# Patient Record
Sex: Male | Born: 1960 | Race: White | Hispanic: No | Marital: Single | Smoking: Current every day smoker
Health system: Southern US, Community
[De-identification: ages and names within clinical notes are randomized; demographics above are authoritative.]

## PROBLEM LIST (undated history)

## (undated) DIAGNOSIS — F17200 Nicotine dependence, unspecified, uncomplicated: Secondary | ICD-10-CM

## (undated) DIAGNOSIS — E785 Hyperlipidemia, unspecified: Secondary | ICD-10-CM

## (undated) DIAGNOSIS — R002 Palpitations: Secondary | ICD-10-CM

## (undated) DIAGNOSIS — I10 Essential (primary) hypertension: Secondary | ICD-10-CM

## (undated) HISTORY — PX: HERNIA REPAIR: SHX51

## (undated) HISTORY — PX: BACK SURGERY: SHX140

---

## 2017-11-24 ENCOUNTER — Encounter (HOSPITAL_COMMUNITY): Payer: Self-pay | Admitting: Emergency Medicine

## 2017-11-24 ENCOUNTER — Observation Stay (HOSPITAL_COMMUNITY): Payer: 59

## 2017-11-24 ENCOUNTER — Other Ambulatory Visit: Payer: Self-pay

## 2017-11-24 ENCOUNTER — Emergency Department (HOSPITAL_COMMUNITY): Payer: 59

## 2017-11-24 ENCOUNTER — Observation Stay (HOSPITAL_COMMUNITY)
Admission: EM | Admit: 2017-11-24 | Discharge: 2017-11-25 | Disposition: A | Discharge status: Home / Self Care | Payer: 59 | Attending: Cardiovascular Disease | Admitting: Cardiovascular Disease

## 2017-11-24 DIAGNOSIS — R0789 Other chest pain: Principal | ICD-10-CM | POA: Insufficient documentation

## 2017-11-24 DIAGNOSIS — Z720 Tobacco use: Secondary | ICD-10-CM

## 2017-11-24 DIAGNOSIS — N189 Chronic kidney disease, unspecified: Secondary | ICD-10-CM | POA: Diagnosis not present

## 2017-11-24 DIAGNOSIS — I2510 Atherosclerotic heart disease of native coronary artery without angina pectoris: Secondary | ICD-10-CM | POA: Insufficient documentation

## 2017-11-24 DIAGNOSIS — E782 Mixed hyperlipidemia: Secondary | ICD-10-CM | POA: Diagnosis not present

## 2017-11-24 DIAGNOSIS — R002 Palpitations: Secondary | ICD-10-CM

## 2017-11-24 DIAGNOSIS — I10 Essential (primary) hypertension: Secondary | ICD-10-CM

## 2017-11-24 DIAGNOSIS — I700 Atherosclerosis of aorta: Secondary | ICD-10-CM | POA: Diagnosis not present

## 2017-11-24 DIAGNOSIS — E785 Hyperlipidemia, unspecified: Secondary | ICD-10-CM | POA: Diagnosis present

## 2017-11-24 DIAGNOSIS — Z7982 Long term (current) use of aspirin: Secondary | ICD-10-CM | POA: Diagnosis not present

## 2017-11-24 DIAGNOSIS — R079 Chest pain, unspecified: Secondary | ICD-10-CM | POA: Diagnosis present

## 2017-11-24 DIAGNOSIS — F17210 Nicotine dependence, cigarettes, uncomplicated: Secondary | ICD-10-CM | POA: Diagnosis not present

## 2017-11-24 DIAGNOSIS — F17200 Nicotine dependence, unspecified, uncomplicated: Secondary | ICD-10-CM | POA: Diagnosis not present

## 2017-11-24 DIAGNOSIS — Z79899 Other long term (current) drug therapy: Secondary | ICD-10-CM | POA: Insufficient documentation

## 2017-11-24 DIAGNOSIS — I129 Hypertensive chronic kidney disease with stage 1 through stage 4 chronic kidney disease, or unspecified chronic kidney disease: Secondary | ICD-10-CM | POA: Insufficient documentation

## 2017-11-24 HISTORY — DX: Essential (primary) hypertension: I10

## 2017-11-24 HISTORY — DX: Hyperlipidemia, unspecified: E78.5

## 2017-11-24 HISTORY — DX: Nicotine dependence, unspecified, uncomplicated: F17.200

## 2017-11-24 HISTORY — DX: Palpitations: R00.2

## 2017-11-24 LAB — BASIC METABOLIC PANEL
ANION GAP: 8 (ref 5–15)
BUN: 14 mg/dL (ref 6–20)
CHLORIDE: 107 mmol/L (ref 101–111)
CO2: 23 mmol/L (ref 22–32)
CREATININE: 0.84 mg/dL (ref 0.61–1.24)
Calcium: 9.5 mg/dL (ref 8.9–10.3)
GFR calc non Af Amer: >60 mL/min (ref >60)
GLUCOSE: 101 mg/dL — AB (ref 65–99)
Potassium: 4.2 mmol/L (ref 3.5–5.1)
Sodium: 138 mmol/L (ref 135–145)

## 2017-11-24 LAB — CBC
HCT: 43.2 % (ref 39.0–52.0)
HEMATOCRIT: 43.3 % (ref 39.0–52.0)
HEMOGLOBIN: 14.5 g/dL (ref 13.0–17.0)
HEMOGLOBIN: 14.3 g/dL (ref 13.0–17.0)
MCH: 30.8 pg (ref 26.0–34.0)
MCH: 30.4 pg (ref 26.0–34.0)
MCHC: 33.6 g/dL (ref 30.0–36.0)
MCHC: 33 g/dL (ref 30.0–36.0)
MCV: 91.7 fL (ref 78.0–100.0)
MCV: 92.1 fL (ref 78.0–100.0)
Platelets: 246 10*3/uL (ref 150–400)
Platelets: 250 10*3/uL (ref 150–400)
RBC: 4.71 MIL/uL (ref 4.22–5.81)
RBC: 4.7 MIL/uL (ref 4.22–5.81)
RDW: 12.7 % (ref 11.5–15.5)
RDW: 12.7 % (ref 11.5–15.5)
WBC: 6.9 10*3/uL (ref 4.0–10.5)
WBC: 7.5 10*3/uL (ref 4.0–10.5)

## 2017-11-24 LAB — TROPONIN I

## 2017-11-24 LAB — I-STAT TROPONIN, ED: Troponin i, poc: 0 ng/mL (ref 0.00–0.08)

## 2017-11-24 LAB — CREATININE, SERUM
Creatinine, Ser: 0.89 mg/dL (ref 0.61–1.24)
GFR calc Af Amer: >60 mL/min (ref >60)
GFR calc non Af Amer: >60 mL/min (ref >60)

## 2017-11-24 LAB — MAGNESIUM: MAGNESIUM: 2.3 mg/dL (ref 1.7–2.4)

## 2017-11-24 LAB — MRSA PCR SCREENING: MRSA by PCR: NEGATIVE

## 2017-11-24 MED ORDER — IOPAMIDOL (ISOVUE-370) INJECTION 76%
INTRAVENOUS | Status: AC
  Administered 2017-11-24: 80 mL
  Filled 2017-11-24: qty 100

## 2017-11-24 MED ORDER — ONDANSETRON HCL 4 MG/2ML IJ SOLN: 4.0000 mg | Freq: Four times a day (QID) | INTRAMUSCULAR | Status: DC | PRN

## 2017-11-24 MED ORDER — NITROGLYCERIN 0.4 MG SL SUBL: 0.4000 mg | SUBLINGUAL_TABLET | SUBLINGUAL | Status: DC | PRN

## 2017-11-24 MED ORDER — NITROGLYCERIN 0.4 MG SL SUBL
SUBLINGUAL_TABLET | SUBLINGUAL | Status: AC
  Filled 2017-11-24: qty 2

## 2017-11-24 MED ORDER — ASPIRIN 300 MG RE SUPP: 300.0000 mg | RECTAL | Status: DC

## 2017-11-24 MED ORDER — ASPIRIN EC 81 MG PO TBEC
81.0000 mg | DELAYED_RELEASE_TABLET | Freq: Every day | ORAL | Status: DC
  Administered 2017-11-25: 81 mg via ORAL
  Filled 2017-11-24: qty 1

## 2017-11-24 MED ORDER — METOPROLOL TARTRATE 5 MG/5ML IV SOLN
5.0000 mg | Freq: Once | INTRAVENOUS | Status: AC
  Administered 2017-11-24: 5 mg via INTRAVENOUS
  Filled 2017-11-24: qty 5

## 2017-11-24 MED ORDER — LISINOPRIL 20 MG PO TABS
20.0000 mg | ORAL_TABLET | Freq: Every day | ORAL | Status: DC
  Administered 2017-11-25: 20 mg via ORAL
  Filled 2017-11-24: qty 1

## 2017-11-24 MED ORDER — ACETAMINOPHEN 325 MG PO TABS: 650.0000 mg | ORAL_TABLET | ORAL | Status: DC | PRN

## 2017-11-24 MED ORDER — ATORVASTATIN CALCIUM 20 MG PO TABS
20.0000 mg | ORAL_TABLET | Freq: Every day | ORAL | Status: DC
  Administered 2017-11-25: 20 mg via ORAL
  Filled 2017-11-24: qty 1

## 2017-11-24 MED ORDER — ASPIRIN 81 MG PO CHEW: 324.0000 mg | CHEWABLE_TABLET | ORAL | Status: DC

## 2017-11-24 MED ORDER — NITROGLYCERIN 0.4 MG SL SUBL: 0.8000 mg | SUBLINGUAL_TABLET | Freq: Once | SUBLINGUAL | Status: DC

## 2017-11-24 MED ORDER — HEPARIN SODIUM (PORCINE) 5000 UNIT/ML IJ SOLN
5000.0000 [IU] | Freq: Three times a day (TID) | INTRAMUSCULAR | Status: DC
  Administered 2017-11-24 – 2017-11-25 (×3): 5000 [IU] via SUBCUTANEOUS
  Filled 2017-11-24 (×3): qty 1

## 2017-11-24 NOTE — ED Triage Notes (Signed)
PT reports he developed intermittent chest pain yesterday around noon. PT was seen at [facility]. PT reports intermittent chest pain with soreness to left chest and in left arm. Not exacerbated by movement. Duration varies. PT had diaphoresis with an episode earlier today. PT reports increased fatigue after episodes. PT had 2 SL nitro in route that decreased pain from 6/10 to 2/10.   PT had 324 ASA in route as well.

## 2017-11-24 NOTE — H&P (Signed)
Physician History and Physical     Patient ID: Frederick Aguilar MRN: [id number] DOB/AGE: 1961-06-15 57 y.o. Admit date: 11/24/2017  Primary Care Physician: Eartha Inch, MD Primary Cardiologist: [facility]  Active Problems:   * No active hospital problems. *   HPI:  57 y.o. no known CAD. History of palpitations seen by cardiologist over 5 years ago but never could get use to wearing monitor. CRF;s smoking, HLD and HTN. Had long episode of tachy palpitations this am. Lasted over an hour. Associated with chest tightness but no dyspnea. Some diaphoresis. EMS with no arrhythmia SL nitro some benefit. Asymptomatic now. Initial ECG normal with no pre excitation Telemetry NSR. Initial enzymes negative He is from [place] and has 4 boys Works for [employer]. Palpitations are usually sudden onset Gets about every 3 months Feels exhausted afterwards and usually needs to sleep. No known thyroid disease. No stimulants or drugs.   Review of systems complete and found to be negative unless listed above   Past Medical History:  Diagnosis Date  . Hyperlipemia   . Hypertension     No family history on file.  Social History   Socioeconomic History  . Marital status: Single    Spouse name: Not on file  . Number of children: Not on file  . Years of education: Not on file  . Highest education level: Not on file  Occupational History  . Not on file  Social Needs  . Financial resource strain: Not on file  . Food insecurity:    Worry: Not on file    Inability: Not on file  . Transportation needs:    Medical: Not on file    Non-medical: Not on file  Tobacco Use  . Smoking status: Current Every Day Smoker    Packs/day: 0.50    Types: Cigarettes  . Smokeless tobacco: Never Used  Substance and Sexual Activity  . Alcohol use: Yes  . Drug use: Never  . Sexual activity: Not on file  Lifestyle  . Physical activity:    Days per week: Not on file    Minutes per session: Not on file  . Stress: Not on  file  Relationships  . Social connections:    Talks on phone: Not on file    Gets together: Not on file    Attends religious service: Not on file    Active member of club or organization: Not on file    Attends meetings of clubs or organizations: Not on file    Relationship status: Not on file  . Intimate partner violence:    Fear of current or ex partner: Not on file    Emotionally abused: Not on file    Physically abused: Not on file    Forced sexual activity: Not on file  Other Topics Concern  . Not on file  Social History Narrative  . Not on file    Past Surgical History:  Procedure Laterality Date  . BACK SURGERY    . HERNIA REPAIR        (Not in a hospital admission)  Physical Exam: Blood pressure 132/83, pulse (!) 56, temperature 98.2 F (36.8 C), temperature source Oral, resp. rate 11, weight 180 lb (81.6 kg), SpO2 98 %.    Affect appropriate Healthy:  appears stated age HEENT: normal Neck supple with no adenopathy JVP normal no bruits no thyromegaly Lungs clear with no wheezing and good diaphragmatic motion Heart:  S1/S2 no murmur, no rub, gallop or click PMI normal  Abdomen: benighn, BS positve, no tenderness, no AAA no bruit.  No HSM or HJR Distal pulses intact with no bruits No edema Neuro non-focal Skin warm and dry No muscular weakness Missing tips 3rd and 4th finger on right hand old wood chip accident  No current facility-administered medications on file prior to encounter.    Current Outpatient Medications on File Prior to Encounter  Medication Sig Dispense Refill  . aspirin EC 81 MG tablet Take 81 mg by mouth daily.    Marland Kitchen. atorvastatin (LIPITOR) 40 MG tablet Take 20 mg by mouth daily.  1  . lisinopril (PRINIVIL,ZESTRIL) 20 MG tablet Take 20 mg by mouth daily.  1  . Multiple Vitamin (MULTIVITAMIN WITH MINERALS) TABS tablet Take 1 tablet by mouth daily.      Labs:   Lab Results  Component Value Date   WBC 6.9 11/24/2017   HGB 14.5  11/24/2017   HCT 43.2 11/24/2017   MCV 91.7 11/24/2017   PLT 246 11/24/2017   Recent Labs  Lab 11/24/17 0952  NA 138  K 4.2  CL 107  CO2 23  BUN 14  CREATININE 0.84  CALCIUM 9.5  GLUCOSE 101*   No results found for: CKTOTAL, CKMB, CKMBINDEX, TROPONINI  No results found for: CHOL No results found for: HDL No results found for: LDLCALC No results found for: TRIG No results found for: CHOLHDL No results found for: LDLDIRECT     Radiology: Dg Chest 2 View  Result Date: 11/24/2017 CLINICAL DATA:  Upper left chest pain today, smoking history EXAM: CHEST - 2 VIEW COMPARISON:  None. FINDINGS: No active infiltrate or effusion is seen. Mediastinal and hilar contours are unremarkable. The heart is within normal limits in size. No bony abnormality is seen. IMPRESSION: No active cardiopulmonary disease. Electronically Signed   By: Dwyane DeePaul  Barry M.D.   On: 11/24/2017 10:42    EKG: NSR normal ECG   ASSESSMENT AND PLAN:   Chest pain:  CRFls HTN, HLD smoking recurrent some help nitro EMS discussed options will order cardiac CTA has 18 g in left antecubital already  Palpitations : No arrhythmia noted ECG normal will likely need outpatient event monitor and PRN inderal  Smoking:  Discussed cessation wife is very worried about it CXR NAD  HTN:  Well controlled.  Continue current medications and low sodium Dash type diet.    HLD:  Continue statin    Signed: Theron Aristaeter Nishan4/04/2018, 12:07 PM

## 2017-11-24 NOTE — ED Notes (Signed)
Transport arrived for CT.

## 2017-11-24 NOTE — ED Provider Notes (Signed)
[facility] EMERGENCY DEPARTMENT Provider Note   CSN: [id number] Arrival date & time: 11/24/17  0931     History   Chief Complaint Chief Complaint  Patient presents with  . Chest Pain    HPI Frederick Aguilar is a 57 y.o. male with history of hypertension, hyperlipidemia who presents following an episode of tachycardia, chest pain, diaphoresis.  Patient reports he has had episodes like this in the past and gets them several times per year. They usually resolve in 1-2 hours if he lies down and takes a nap.  This time, the episode lasted longer.  He reports it started yesterday and improved a little before bed, however then began early this morning and lasted about 3 hours.  He went to his doctor.  He was sent here for further evaluation.  His chest pain is improving, however patient did get nitroglycerin in route.  He also was given aspirin 324 mg.  Patient reports he had left arm pain while he had chest pain.  He denies shortness of breath, nausea, vomiting, or abdominal pain.  He describes pain as a soreness.  It is not pleuritic.  Patient reports he is mostly back to baseline now.  Patient denies any recent long trips, surgeries, known cancer, new leg pain or swelling, history of blood clots.  Patient smokes 7-8 cigarettes daily.  HPI  Past Medical History:  Diagnosis Date  . Hyperlipemia   . Hypertension     Patient Active Problem List   Diagnosis Date Noted  . Chest pain 11/24/2017    Past Surgical History:  Procedure Laterality Date  . BACK SURGERY    . HERNIA REPAIR          Home Medications    Prior to Admission medications   Medication Sig Start Date End Date Taking? Authorizing Provider  aspirin EC 81 MG tablet Take 81 mg by mouth daily.   Yes [provider]  atorvastatin (LIPITOR) 40 MG tablet Take 20 mg by mouth daily. 11/15/17  Yes [provider]  lisinopril (PRINIVIL,ZESTRIL) 20 MG tablet Take 20 mg by mouth daily. 09/25/17   Yes [provider]  Multiple Vitamin (MULTIVITAMIN WITH MINERALS) TABS tablet Take 1 tablet by mouth daily.   Yes [provider]    Family History No family history on file.  Social History Social History   Tobacco Use  . Smoking status: Current Every Day Smoker    Packs/day: 0.50    Types: Cigarettes  . Smokeless tobacco: Never Used  Substance Use Topics  . Alcohol use: Yes  . Drug use: Never     Allergies   Patient has no known allergies.   Review of Systems Review of Systems  Constitutional: Positive for diaphoresis. Negative for chills and fever.  HENT: Negative for facial swelling and sore throat.   Respiratory: Negative for shortness of breath.   Cardiovascular: Positive for chest pain and palpitations.  Gastrointestinal: Negative for abdominal pain, nausea and vomiting.  Genitourinary: Negative for dysuria.  Musculoskeletal: Negative for back pain.  Skin: Negative for rash and wound.  Neurological: Negative for headaches.  Psychiatric/Behavioral: The patient is not nervous/anxious.      Physical Exam Updated Vital Signs BP 120/75   Pulse (!) 58   Temp 98.2 F (36.8 C) (Oral)   Resp 16   Wt 81.6 kg (180 lb)   SpO2 98%   Physical Exam  Constitutional: He appears well-developed and well-nourished. No distress.  HENT:  Head:  Normocephalic and atraumatic.  Mouth/Throat: Oropharynx is clear and moist. No oropharyngeal exudate.  Eyes: Pupils are equal, round, and reactive to light. Conjunctivae are normal. Right eye exhibits no discharge. Left eye exhibits no discharge. No scleral icterus.  Neck: Normal range of motion. Neck supple. No thyromegaly present.  Cardiovascular: Normal rate, regular rhythm, normal heart sounds and intact distal pulses. Exam reveals no gallop and no friction rub.  No murmur heard. Pulmonary/Chest: Effort normal and breath sounds normal. No stridor. No respiratory distress. He has no wheezes. He has no rales. He  exhibits no tenderness.  Abdominal: Soft. Bowel sounds are normal. He exhibits no distension. There is no tenderness. There is no rebound and no guarding.  Musculoskeletal: He exhibits no edema.  Lymphadenopathy:    He has no cervical adenopathy.  Neurological: He is alert. Coordination normal.  Skin: Skin is warm and dry. No rash noted. He is not diaphoretic. No pallor.  Psychiatric: He has a normal mood and affect.  Nursing note and vitals reviewed.    ED Treatments / Results  Labs (all labs ordered are listed, but only abnormal results are displayed) Labs Reviewed  BASIC METABOLIC PANEL - Abnormal; Notable for the following components:      Result Value   Glucose, Bld 101 (*)    All other components within normal limits  CBC  CBC  CREATININE, SERUM  MAGNESIUM  TROPONIN I  TROPONIN I  TROPONIN I  I-STAT TROPONIN, ED    EKG None  Radiology Dg Chest 2 View  Result Date: 11/24/2017 CLINICAL DATA:  Upper left chest pain today, smoking history EXAM: CHEST - 2 VIEW COMPARISON:  None. FINDINGS: No active infiltrate or effusion is seen. Mediastinal and hilar contours are unremarkable. The heart is within normal limits in size. No bony abnormality is seen. IMPRESSION: No active cardiopulmonary disease. Electronically Signed   By: Dwyane DeePaul  Barry M.D.   On: 11/24/2017 10:42    Procedures Procedures (including critical care time)  Medications Ordered in ED Medications  metoprolol tartrate (LOPRESSOR) injection 5 mg (has no administration in time range)  aspirin chewable tablet 324 mg (324 mg Oral Not Given 11/24/17 1400)    Or  aspirin suppository 300 mg ( Rectal See Alternative 11/24/17 1400)  aspirin EC tablet 81 mg (has no administration in time range)  nitroGLYCERIN (NITROSTAT) SL tablet 0.4 mg (has no administration in time range)  acetaminophen (TYLENOL) tablet 650 mg (has no administration in time range)  ondansetron (ZOFRAN) injection 4 mg (has no administration in time  range)  heparin injection 5,000 Units (has no administration in time range)  atorvastatin (LIPITOR) tablet 20 mg (has no administration in time range)  lisinopril (PRINIVIL,ZESTRIL) tablet 20 mg (has no administration in time range)  iopamidol (ISOVUE-370) 76 % injection (has no administration in time range)     Initial Impression / Assessment and Plan / ED Course  I have reviewed the triage vital signs and the nursing notes.  Pertinent labs & imaging results that were available during my care of the patient were reviewed by me and considered in my medical decision making (see chart for details).     Patient with recurrent episode of palpitations and chest pain.  He had associated diaphoresis.  His pain was improved with nitroglycerin.  Initial troponin is negative.  EKG shows NSR here.  I consulted cardiology and spoke with Dr. Eden EmmsNishan admission who evaluated the patient and will admit the patient for observation and further evaluation on  their service. I appreciate Dr. Fabio Bering assistance with this patient. I discussed patient case with Dr. Adela Lank who guided the patient's management and agrees with plan.   Final Clinical Impressions(s) / ED Diagnoses   Final diagnoses:  Atypical chest pain  Palpitations    ED Discharge Orders    None       Emi Holes, PA-C 11/24/17 1437    Melene Plan, DO 11/24/17 1451

## 2017-11-24 NOTE — ED Notes (Signed)
Patient transported to X-ray 

## 2017-11-25 ENCOUNTER — Encounter (HOSPITAL_COMMUNITY): Payer: Self-pay | Admitting: Physician Assistant

## 2017-11-25 ENCOUNTER — Other Ambulatory Visit: Payer: Self-pay | Admitting: Physician Assistant

## 2017-11-25 ENCOUNTER — Telehealth: Payer: Self-pay | Admitting: Cardiovascular Disease

## 2017-11-25 DIAGNOSIS — R0789 Other chest pain: Secondary | ICD-10-CM | POA: Diagnosis not present

## 2017-11-25 DIAGNOSIS — I10 Essential (primary) hypertension: Secondary | ICD-10-CM | POA: Diagnosis present

## 2017-11-25 DIAGNOSIS — R002 Palpitations: Secondary | ICD-10-CM

## 2017-11-25 DIAGNOSIS — R079 Chest pain, unspecified: Secondary | ICD-10-CM

## 2017-11-25 DIAGNOSIS — Z720 Tobacco use: Secondary | ICD-10-CM

## 2017-11-25 DIAGNOSIS — N189 Chronic kidney disease, unspecified: Secondary | ICD-10-CM | POA: Diagnosis not present

## 2017-11-25 DIAGNOSIS — I129 Hypertensive chronic kidney disease with stage 1 through stage 4 chronic kidney disease, or unspecified chronic kidney disease: Secondary | ICD-10-CM | POA: Diagnosis not present

## 2017-11-25 DIAGNOSIS — E785 Hyperlipidemia, unspecified: Secondary | ICD-10-CM | POA: Diagnosis present

## 2017-11-25 LAB — CBC
HCT: 43.6 % (ref 39.0–52.0)
Hemoglobin: 14.6 g/dL (ref 13.0–17.0)
MCH: 31.1 pg (ref 26.0–34.0)
MCHC: 33.5 g/dL (ref 30.0–36.0)
MCV: 92.8 fL (ref 78.0–100.0)
Platelets: 238 10*3/uL (ref 150–400)
RBC: 4.7 MIL/uL (ref 4.22–5.81)
RDW: 12.9 % (ref 11.5–15.5)
WBC: 7.2 10*3/uL (ref 4.0–10.5)

## 2017-11-25 LAB — LIPID PANEL
Cholesterol: 160 mg/dL (ref 0–200)
HDL: 42 mg/dL (ref >40)
LDL CALC: 71 mg/dL (ref 0–99)
Total CHOL/HDL Ratio: 3.8 RATIO
Triglycerides: 233 mg/dL — ABNORMAL HIGH (ref <150)
VLDL: 47 mg/dL — AB (ref 0–40)

## 2017-11-25 LAB — BASIC METABOLIC PANEL
ANION GAP: 11 (ref 5–15)
BUN: 16 mg/dL (ref 6–20)
CALCIUM: 9 mg/dL (ref 8.9–10.3)
CHLORIDE: 104 mmol/L (ref 101–111)
CO2: 25 mmol/L (ref 22–32)
CREATININE: 0.97 mg/dL (ref 0.61–1.24)
GFR calc Af Amer: >60 mL/min (ref >60)
GFR calc non Af Amer: >60 mL/min (ref >60)
GLUCOSE: 111 mg/dL — AB (ref 65–99)
Potassium: 4.1 mmol/L (ref 3.5–5.1)
Sodium: 140 mmol/L (ref 135–145)

## 2017-11-25 LAB — TROPONIN I: Troponin I: <0.03 ng/mL (ref <0.03)

## 2017-11-25 MED ORDER — PROPRANOLOL HCL 10 MG PO TABS: 10.0000 mg | ORAL_TABLET | Freq: Every day | ORAL | 6 refills | Status: AC | PRN

## 2017-11-25 MED ORDER — PROPRANOLOL HCL 10 MG PO TABS
10.0000 mg | ORAL_TABLET | Freq: Every day | ORAL | Status: DC | PRN
  Filled 2017-11-25: qty 1

## 2017-11-25 NOTE — Discharge Summary (Addendum)
Discharge Summary    Patient ID: Frederick Aguilar,  MRN: [id number], DOB/AGE: 1961/06/15 57 y.o.  Admit date: 11/24/2017 Discharge date: 11/25/2017  Primary Care Provider: Eartha InchBadger, Michael C Primary Cardiologist: Charlton HawsPeter Nishan, MD  Discharge Diagnoses    Principal Problem:   Chest pain Active Problems:   Palpitations   Hypertension   Hyperlipemia   Tobacco use   Allergies No Known Allergies  Diagnostic Studies/Procedures    Coronary CT 11/24/17: IMPRESSION: 1. Normal aortic root 3.3 cm 2. Calcium score 30 limited to LAD this is 61 st percentile for age and sex 3. Non obstructive CAD Less than 30% calcific plaque in proximal LAD   History of Present Illness     57 y.o. no known CAD. History of palpitations seen by cardiologist over 5 years ago but never could get use to wearing monitor. CRF;s smoking, HLD and HTN. Had long episode of tachy palpitations this am. Lasted over an hour. Associated with chest tightness but no dyspnea. Some diaphoresis. EMS with no arrhythmia SL nitro some benefit. Asymptomatic now. Initial ECG normal with no pre excitation Telemetry NSR. Initial enzymes negative [place] and has 4 boys Works for [employer]. Palpitations are usually sudden onset Gets about every 3 months Feels exhausted afterwards and usually needs to sleep. No known thyroid disease. No stimulants or drugs.  Hospital Course     Consultants: none  Chest pain Pt was admitted to cardiology overnight for observation. Troponin trended overnight and remained negative. EKG without changes. Coronary CT with nonobstructive CAD, less than 30% calcific plaque in proximal LAD. Although he has risk factors for ACS, including HTN, HLD, and smoking, this chest pain is likely not cardiac in nature.    Palpitations No arrhythmias noted on EKG or telemetry. PRN inderal started. Arranged for outpatient even monitor.  See appt on AVS.   HTN Pressures have been well-controlled. Continue  lisinopril.    HLD Continue statin.   Smoking Discussed cessation.  _____________  Discharge Vitals Blood pressure 126/87, pulse (!) 48, temperature 98.1 F (36.7 C), temperature source Oral, resp. rate 15, height 6' (1.829 m), weight 182 lb 1.6 oz (82.6 kg), SpO2 97 %.  Filed Weights   11/24/17 0945 11/24/17 1808 11/25/17 0641  Weight: 180 lb (81.6 kg) 181 lb (82.1 kg) 182 lb 1.6 oz (82.6 kg)    Labs & Radiologic Studies    CBC Recent Labs    11/24/17 1240 11/25/17 0235  WBC 7.5 7.2  HGB 14.3 14.6  HCT 43.3 43.6  MCV 92.1 92.8  PLT 250 238   Basic Metabolic Panel Recent Labs    11/91/4703/05/07 0952 11/24/17 1240 11/25/17 0235  NA 138  --  140  K 4.2  --  4.1  CL 107  --  104  CO2 23  --  25  GLUCOSE 101*  --  111*  BUN 14  --  16  CREATININE 0.84 0.89 0.97  CALCIUM 9.5  --  9.0  MG  --  2.3  --    Liver Function Tests No results for input(s): AST, ALT, ALKPHOS, BILITOT, PROT, ALBUMIN in the last 72 hours. No results for input(s): LIPASE, AMYLASE in the last 72 hours. Cardiac Enzymes Recent Labs    11/24/17 1240 11/24/17 1912 11/25/17 0013  TROPONINI <0.03 <0.03 <0.03   BNP Invalid input(s): POCBNP D-Dimer No results for input(s): DDIMER in the last 72 hours. Hemoglobin A1C No results for input(s): HGBA1C in the last 72  hours. Fasting Lipid Panel Recent Labs    11/25/17 0235  CHOL 160  HDL 42  LDLCALC 71  TRIG 233*  CHOLHDL 3.8   Thyroid Function Tests No results for input(s): TSH, T4TOTAL, T3FREE, THYROIDAB in the last 72 hours.  Invalid input(s): FREET3 _____________  Dg Chest 2 View  Result Date: 11/24/2017 CLINICAL DATA:  Upper left chest pain today, smoking history EXAM: CHEST - 2 VIEW COMPARISON:  None. FINDINGS: No active infiltrate or effusion is seen. Mediastinal and hilar contours are unremarkable. The heart is within normal limits in size. No bony abnormality is seen. IMPRESSION: No active cardiopulmonary disease. Electronically  Signed   By: Dwyane Dee M.D.   On: 11/24/2017 10:42   Ct Coronary Morph W/cta Cor W/score W/ca W/cm &/or Wo/cm  Addendum Date: 11/24/2017   ADDENDUM REPORT: 11/24/2017 16:23 CLINICAL DATA:  Chest pain EXAM: Cardiac CTA MEDICATIONS: Sub lingual nitro. 4mg  and lopressor 5mg  TECHNIQUE: The patient was scanned on a Siemens Force 192 slice scanner. Gantry rotation speed was 270 msecs. Collimation was .9mm. A 100 kV prospective scan was triggered in the descending thoracic aorta at 111 HU's with 5% padding centered around 78% of the R-R interval. Average HR during the scan was 48 bpm. The 3D data set was interpreted on a dedicated work station using MPR, MIP and VRT modes. A total of 80 cc of contrast was used. FINDINGS: Non-cardiac: See separate report from [facility]. No significant findings on limited lung and soft tissue windows. Calcium Score: Mild calcium noted in proximal LAD Coronary Arteries: Right dominant with no anomalies LM: Normal LAD: Less than 30% calcific plaque in proximal LAD Large septal perforator branch D1: Normal D2: Normal D3: Normal Circumflex: Normal OM1: Small normal OM2: Normal OM3: Normal RCA: Dominant and normal PDA: Normal PLA: Normal IMPRESSION: 1. Normal aortic root 3.3 cm 2. Calcium score 30 limited to LAD this is 61 st percentile for age and sex 3. Non obstructive CAD Less than 30% calcific plaque in proximal LAD Charlton Haws Electronically Signed   By: Charlton Haws M.D.   On: 11/24/2017 16:23   Result Date: 11/24/2017 EXAM: OVER-READ INTERPRETATION  CT CHEST The following report is an over-read performed by radiologist Dr. Trudie Reed of [facility] on 11/24/2017. This over-read does not include interpretation of cardiac or coronary anatomy or pathology. The coronary calcium score/coronary CTA interpretation by the cardiologist is attached. COMPARISON:  None. FINDINGS: Aortic atherosclerosis. Within the visualized portions of the thorax there are no  suspicious appearing pulmonary nodules or masses, there is no acute consolidative airspace disease, no pleural effusions, no pneumothorax and no lymphadenopathy. Visualized portions of the upper abdomen are unremarkable. There are no aggressive appearing lytic or blastic lesions noted in the visualized portions of the skeleton. IMPRESSION: 1.  Aortic Atherosclerosis (ICD10-I70.0). Electronically Signed: By: Trudie Reed M.D. On: 11/24/2017 16:03   Disposition   Pt is being discharged home today in good condition.  Follow-up Plans & Appointments    Follow-up Information    [place], Sharrell Ku, [place] Follow up on 12/07/2017.   Specialty:  Cardiology Why:  9:00 AM for TCM hospital follow up Contact information: [address] [place] [address] [phone]        Wendall Stade, MD Follow up on 03/08/2018.   Specialty:  Cardiology Why:  10:00 AM for follow up event monitor Contact information: [address] [place] [address] [phone]  [facility] [address] Follow up on 11/26/2017.   Specialty:  Cardiology Why:  9:00 AM for event monitor placement Contact information: [address], [address] [place] [address] [phone]         Discharge Instructions    Diet - low sodium heart healthy   Complete by:  As directed    Increase activity slowly   Complete by:  As directed       Discharge Medications   Allergies as of 11/25/2017   No Known Allergies     Medication List    STOP taking these medications   multivitamin with minerals Tabs tablet     TAKE these medications   aspirin EC 81 MG tablet Take 81 mg by mouth daily.   atorvastatin 40 MG tablet Commonly known as:  LIPITOR Take 20 mg by mouth daily.   lisinopril 20 MG tablet Commonly known as:  PRINIVIL,ZESTRIL Take 20 mg by mouth daily.   propranolol 10 MG tablet Commonly known as:  INDERAL Take 1 tablet (10 mg total) by  mouth daily as needed (palpitations).        Outstanding Labs/Studies   Event monitor  Duration of Discharge Encounter   Greater than 30 minutes including physician time.  Signed, Roe Rutherford Evgenia Merriman NP 11/25/2017, 9:17 AM

## 2017-11-25 NOTE — Progress Notes (Signed)
Subjective:  Denies SSCP, palpitations or Dyspnea   Objective:  Vitals:   11/25/17 0325 11/25/17 0641 11/25/17 0705 11/25/17 0819  BP: 112/79   126/87  Pulse: 76  (!) 51 (!) 48  Resp: (!) 22  14 15   Temp: 98.2 F (36.8 C)   98.1 F (36.7 C)  TempSrc: Oral   Oral  SpO2: 97%  95% 97%  Weight:  182 lb 1.6 oz (82.6 kg)    Height:        Intake/Output from previous day:  Intake/Output Summary (Last 24 hours) at 11/25/2017 0845 Last data filed at 11/24/2017 2200 Gross per 24 hour  Intake 480 ml  Output -  Net 480 ml    Physical Exam: Affect appropriate Healthy:  appears stated age HEENT: normal Neck supple with no adenopathy JVP normal no bruits no thyromegaly Lungs clear with no wheezing and good diaphragmatic motion Heart:  S1/S2 no murmur, no rub, gallop or click PMI normal Abdomen: benighn, BS positve, no tenderness, no AAA no bruit.  No HSM or HJR Distal pulses intact with no bruits No edema Neuro non-focal Skin warm and dry No muscular weakness   Lab Results: Basic Metabolic Panel: Recent Labs    11/24/17 0952 11/24/17 1240 11/25/17 0235  NA 138  --  140  K 4.2  --  4.1  CL 107  --  104  CO2 23  --  25  GLUCOSE 101*  --  111*  BUN 14  --  16  CREATININE 0.84 0.89 0.97  CALCIUM 9.5  --  9.0  MG  --  2.3  --    Liver Function Tests: No results for input(s): AST, ALT, ALKPHOS, BILITOT, PROT, ALBUMIN in the last 72 hours. No results for input(s): LIPASE, AMYLASE in the last 72 hours. CBC: Recent Labs    11/24/17 1240 11/25/17 0235  WBC 7.5 7.2  HGB 14.3 14.6  HCT 43.3 43.6  MCV 92.1 92.8  PLT 250 238   Cardiac Enzymes: Recent Labs    11/24/17 1240 11/24/17 1912 11/25/17 0013  TROPONINI <0.03 <0.03 <0.03   Fasting Lipid Panel: Recent Labs    11/25/17 0235  CHOL 160  HDL 42  LDLCALC 71  TRIG 233*  CHOLHDL 3.8    Imaging: Dg Chest 2 View  Result Date: 11/24/2017 CLINICAL DATA:  Upper left chest pain today, smoking history  EXAM: CHEST - 2 VIEW COMPARISON:  None. FINDINGS: No active infiltrate or effusion is seen. Mediastinal and hilar contours are unremarkable. The heart is within normal limits in size. No bony abnormality is seen. IMPRESSION: No active cardiopulmonary disease. Electronically Signed   By: Dwyane Dee M.D.   On: 11/24/2017 10:42   Ct Coronary Morph W/cta Cor W/score W/ca W/cm &/or Wo/cm  Addendum Date: 11/24/2017   ADDENDUM REPORT: 11/24/2017 16:23 CLINICAL DATA:  Chest pain EXAM: Cardiac CTA MEDICATIONS: Sub lingual nitro. 4mg  and lopressor 5mg  TECHNIQUE: The patient was scanned on a Siemens Force 192 slice scanner. Gantry rotation speed was 270 msecs. Collimation was .9mm. A 100 kV prospective scan was triggered in the descending thoracic aorta at 111 HU's with 5% padding centered around 78% of the R-R interval. Average HR during the scan was 48 bpm. The 3D data set was interpreted on a dedicated work station using MPR, MIP and VRT modes. A total of 80 cc of contrast was used. FINDINGS: Non-cardiac: See separate report from [facility]. No significant findings on limited lung and  soft tissue windows. Calcium Score: Mild calcium noted in proximal LAD Coronary Arteries: Right dominant with no anomalies LM: Normal LAD: Less than 30% calcific plaque in proximal LAD Large septal perforator branch D1: Normal D2: Normal D3: Normal Circumflex: Normal OM1: Small normal OM2: Normal OM3: Normal RCA: Dominant and normal PDA: Normal PLA: Normal IMPRESSION: 1. Normal aortic root 3.3 cm 2. Calcium score 30 limited to LAD this is 61 st percentile for age and sex 3. Non obstructive CAD Less than 30% calcific plaque in proximal LAD Charlton HawsPeter Avrielle Fry Electronically Signed   By: Charlton HawsPeter  Renea Schoonmaker M.D.   On: 11/24/2017 16:23   Result Date: 11/24/2017 EXAM: OVER-READ INTERPRETATION  CT CHEST The following report is an over-read performed by radiologist Dr. Trudie Reedaniel Entrikin of [facility] on 11/24/2017. This over-read does  not include interpretation of cardiac or coronary anatomy or pathology. The coronary calcium score/coronary CTA interpretation by the cardiologist is attached. COMPARISON:  None. FINDINGS: Aortic atherosclerosis. Within the visualized portions of the thorax there are no suspicious appearing pulmonary nodules or masses, there is no acute consolidative airspace disease, no pleural effusions, no pneumothorax and no lymphadenopathy. Visualized portions of the upper abdomen are unremarkable. There are no aggressive appearing lytic or blastic lesions noted in the visualized portions of the skeleton. IMPRESSION: 1.  Aortic Atherosclerosis (ICD10-I70.0). Electronically Signed: By: Trudie Reedaniel  Entrikin M.D. On: 11/24/2017 16:03    Cardiac Studies:  ECG: NSR normal    Telemetry:  NSR no arrhythmia    Cardiac CT calcium score 30 less than 30% calcific plaque in proximal LAD   Medications:   . aspirin  324 mg Oral NOW   Or  . aspirin  300 mg Rectal NOW  . aspirin EC  81 mg Oral Daily  . atorvastatin  20 mg Oral Daily  . heparin  5,000 Units Subcutaneous Q8H  . lisinopril  20 mg Oral Daily  . nitroGLYCERIN  0.8 mg Sublingual Once      Assessment/Plan:  Chest Pain: non cardiac cardiac CT less than 30% calcific plaque in LAD on ASA and statin Palpitations: normal ECG no pre excitation telemetry benign arrange 30 day event monitor And prescribe PRN Inderal 10 mg f/u with PA TOC and me in 3 months HLD:  Continue statin Smoking: discussed cessation CXR and CT portion of lungs ok   Charlton HawsPeter Jatinder Mcdonagh 11/25/2017, 8:45 AM

## 2017-11-25 NOTE — Telephone Encounter (Signed)
New message    Per Micah FlesherAngela Duke PA  TCM 7-14 days   Appt: Bhagat 12/07/17 @9am 

## 2017-11-25 NOTE — Progress Notes (Signed)
Discharge note. Patient and wife educated at bedside. RN educated on medications and when to take them, what medications to start/stop taking, follow-up appointments, and diet and activity recommendations. PIV removed without complications.   Patient discharged home by wheelchair with NT.

## 2017-11-27 NOTE — Telephone Encounter (Signed)
Called patient to discuss TOC appointments.  It looks as though he called and canceled both his follow up monitor and appointment with PA.  The monitor was rescheduled but same day it was canceled.  I have left the patient a message to return call to discuss how he is feeling post hospital and to reschedule follow up with PA.

## 2017-11-27 NOTE — Telephone Encounter (Signed)
Spoke with pt's wife and tried to reschedule pt's appt.  They didn't realized that pt needed to keep appt with APP for post hospital fu. No availability at times pt is able to come.  Advised wife that I would send message to scheduling and have them contact her or pt to get appt scheduled.  Wife did state pt was able to pick up all meds needed and understood d/c instructions.  Wife appreciative for call.

## 2017-12-07 ENCOUNTER — Ambulatory Visit: Payer: 59 | Admitting: Physician Assistant

## 2017-12-08 ENCOUNTER — Ambulatory Visit (INDEPENDENT_AMBULATORY_CARE_PROVIDER_SITE_OTHER): Payer: 59

## 2017-12-08 DIAGNOSIS — R002 Palpitations: Secondary | ICD-10-CM

## 2018-01-12 ENCOUNTER — Telehealth: Payer: Self-pay | Admitting: Cardiovascular Disease

## 2018-01-12 NOTE — Telephone Encounter (Signed)
New Message    Please  Call pt wife back she is returning your call

## 2018-03-02 NOTE — Progress Notes (Deleted)
Physician History and Physical     Patient ID: Frederick Aguilar MRN: [id number] DOB/AGE: [birth year] 57 y.o. Admit date: (Not on file)  Primary Care Physician: Eartha Inch, MD Primary Cardiologist: New/Bradely Rudin  Active Problems:   * No active hospital problems. *  History:  57 y.o. first seen in hospital 11/24/17 history of palpitations. Dated back over 5 years. CRF;s include HTN HLD and smoking Telemetry no arrhythmia ECG normal R/O Palpitations are usually sudden onset Gets about every 3 months Feels exhausted afterwards and usually needs to sleep. No known thyroid disease. No stimulants or drugs Holter showed NSR average HR 66 one run idoventricular rhythm 5 beats no symptom correlation done 12/08/17 Cardiac CT 11/24/17 calcium score 30 limited to LAD 61 st percentile non obstructive plaque in proximal LAD BP controlled with ACE On statin for HLD. D/c with PRN inderal for palpitations  He is from [place] and has 4 boys Works for [employer]. .   ***  Review of systems complete and found to be negative unless listed above   Past Medical History:  Diagnosis Date  . Hyperlipemia   . Hypertension   . Palpitations   . Smoker     No family history on file.  Social History   Socioeconomic History  . Marital status: Single    Spouse name: Not on file  . Number of children: Not on file  . Years of education: Not on file  . Highest education level: Not on file  Occupational History  . Not on file  Social Needs  . Financial resource strain: Not on file  . Food insecurity:    Worry: Not on file    Inability: Not on file  . Transportation needs:    Medical: Not on file    Non-medical: Not on file  Tobacco Use  . Smoking status: Current Every Day Smoker    Packs/day: 0.50    Types: Cigarettes  . Smokeless tobacco: Never Used  Substance and Sexual Activity  . Alcohol use: Yes  . Drug use: Never  . Sexual activity: Not on file  Lifestyle  . Physical activity:    Days per week:  Not on file    Minutes per session: Not on file  . Stress: Not on file  Relationships  . Social connections:    Talks on phone: Not on file    Gets together: Not on file    Attends religious service: Not on file    Active member of club or organization: Not on file    Attends meetings of clubs or organizations: Not on file    Relationship status: Not on file  . Intimate partner violence:    Fear of current or ex partner: Not on file    Emotionally abused: Not on file    Physically abused: Not on file    Forced sexual activity: Not on file  Other Topics Concern  . Not on file  Social History Narrative  . Not on file    Past Surgical History:  Procedure Laterality Date  . BACK SURGERY    . HERNIA REPAIR        (Not in a hospital admission)  Physical Exam: There were no vitals taken for this visit.    Affect appropriate Healthy:  appears stated age HEENT: normal Neck supple with no adenopathy JVP normal no bruits no thyromegaly Lungs clear with no wheezing and good diaphragmatic motion Heart:  S1/S2 no murmur, no rub, gallop or click PMI  normal Abdomen: benighn, BS positve, no tenderness, no AAA no bruit.  No HSM or HJR Distal pulses intact with no bruits No edema Neuro non-focal Skin warm and dry No muscular weakness Missing tips 3rd and 4th finger on right hand old wood chip accident  Current Outpatient Medications on File Prior to Visit  Medication Sig Dispense Refill  . aspirin EC 81 MG tablet Take 81 mg by mouth daily.    Marland Kitchen. atorvastatin (LIPITOR) 40 MG tablet Take 20 mg by mouth daily.  1  . lisinopril (PRINIVIL,ZESTRIL) 20 MG tablet Take 20 mg by mouth daily.  1  . propranolol (INDERAL) 10 MG tablet Take 1 tablet (10 mg total) by mouth daily as needed (palpitations). 30 tablet 6   No current facility-administered medications on file prior to visit.     Labs:   Lab Results  Component Value Date   WBC 7.2 11/25/2017   HGB 14.6 11/25/2017   HCT 43.6  11/25/2017   MCV 92.8 11/25/2017   PLT 238 11/25/2017   No results for input(s): NA, K, CL, CO2, BUN, CREATININE, CALCIUM, PROT, BILITOT, ALKPHOS, ALT, AST, GLUCOSE in the last 168 hours.  Invalid input(s): LABALBU Lab Results  Component Value Date   TROPONINI <0.03 11/25/2017   TROPONINI <0.03 11/24/2017   TROPONINI <0.03 11/24/2017     Lab Results  Component Value Date   CHOL 160 11/25/2017   Lab Results  Component Value Date   HDL 42 11/25/2017   Lab Results  Component Value Date   LDLCALC 71 11/25/2017   Lab Results  Component Value Date   TRIG 233 (H) 11/25/2017   Lab Results  Component Value Date   CHOLHDL 3.8 11/25/2017   No results found for: LDLDIRECT     Radiology: No results found.  EKG: NSR normal ECG 11/25/17 no pre excitation   ASSESSMENT AND PLAN:   Chest pain:  Resolved no significant CAD by cardiac CT 11/24/17  Palpitations : no significant arrhythmia in hospital or on monitor ***  Smoking:  Discussed cessation wife is very worried about it CXR NAD  HTN:  Well controlled.  Continue current medications and low sodium Dash type diet.    HLD:  Continue statin LDL 71 11/25/17 needs f/u LFTls    Signed: Theron AristaPeter Nishan7/16/2019, 2:09 PM

## 2018-03-08 ENCOUNTER — Ambulatory Visit: Payer: 59 | Admitting: Cardiovascular Disease

## 2018-06-04 ENCOUNTER — Encounter

## 2018-06-14 NOTE — Progress Notes (Deleted)
Physician History and Physical     Patient ID: Frederick Aguilar MRN: [id number] DOB/AGE: 56-Apr-1962 56 y.o. Admit date: (Not on file)  Primary Care Physician: Eartha Inch, MD Primary Cardiologist: [facility] F[facility]   HPI:  57 y.o. no known CAD. History of palpitations seen by cardiologist over 5 years ago but never could get use to wearing monitor. CRF;s smoking, HLD and HTN. Had long episode of tachy palpitations 11/24/17 . Lasted over an hour. Associated with chest tightness but no dyspnea. Some diaphoresis. EMS with no arrhythmia SL nitro some benefit. Asymptomatic now. Initial ECG normal with no pre excitation Telemetry NSR. Initial enzymes negative He is from [place] and has 4 boys Works for [employer]. Palpitations are usually sudden onset Gets about every 3 months Feels exhausted afterwards and usually needs to sleep. No known thyroid disease. No stimulants or drugs.   Coronary CTA done 11/24/17 Calcium score 30 61 st percentile no obstructive disease  No arrhythmias noted on telemetry or ECG Given PRN Inderal Event monitor completed 12/08/17 NSR average HR 66 so significant arrhythmias to correlate with symptoms   ***  Review of systems complete and found to be negative unless listed above   Past Medical History:  Diagnosis Date  . Hyperlipemia   . Hypertension   . Palpitations   . Smoker     No family history on file.  Social History   Socioeconomic History  . Marital status: Single    Spouse name: Not on file  . Number of children: Not on file  . Years of education: Not on file  . Highest education level: Not on file  Occupational History  . Not on file  Social Needs  . Financial resource strain: Not on file  . Food insecurity:    Worry: Not on file    Inability: Not on file  . Transportation needs:    Medical: Not on file    Non-medical: Not on file  Tobacco Use  . Smoking status: Current Every Day Smoker    Packs/day: 0.50    Types: Cigarettes  .  Smokeless tobacco: Never Used  Substance and Sexual Activity  . Alcohol use: Yes  . Drug use: Never  . Sexual activity: Not on file  Lifestyle  . Physical activity:    Days per week: Not on file    Minutes per session: Not on file  . Stress: Not on file  Relationships  . Social connections:    Talks on phone: Not on file    Gets together: Not on file    Attends religious service: Not on file    Active member of club or organization: Not on file    Attends meetings of clubs or organizations: Not on file    Relationship status: Not on file  . Intimate partner violence:    Fear of current or ex partner: Not on file    Emotionally abused: Not on file    Physically abused: Not on file    Forced sexual activity: Not on file  Other Topics Concern  . Not on file  Social History Narrative  . Not on file    Past Surgical History:  Procedure Laterality Date  . BACK SURGERY    . HERNIA REPAIR        (Not in a hospital admission)  Physical Exam: There were no vitals taken for this visit. Affect appropriate Healthy:  appears stated age HEENT: normal Neck supple with no adenopathy JVP  normal no bruits no thyromegaly Lungs clear with no wheezing and good diaphragmatic motion Heart:  S1/S2 no murmur, no rub, gallop or click PMI normal Abdomen: benighn, BS positve, no tenderness, no AAA no bruit.  No HSM or HJR Distal pulses intact with no bruits No edema Neuro non-focal Skin warm and dry No muscular weakness Missing tips 3rd and 4th finger on right hand old wood chip accident  Current Outpatient Medications on File Prior to Visit  Medication Sig Dispense Refill  . aspirin EC 81 MG tablet Take 81 mg by mouth daily.    Marland Kitchen atorvastatin (LIPITOR) 40 MG tablet Take 20 mg by mouth daily.  1  . lisinopril (PRINIVIL,ZESTRIL) 20 MG tablet Take 20 mg by mouth daily.  1  . propranolol (INDERAL) 10 MG tablet Take 1 tablet (10 mg total) by mouth daily as needed (palpitations). 30 tablet  6   No current facility-administered medications on file prior to visit.     Labs:   Lab Results  Component Value Date   WBC 7.2 11/25/2017   HGB 14.6 11/25/2017   HCT 43.6 11/25/2017   MCV 92.8 11/25/2017   PLT 238 11/25/2017   No results for input(s): NA, K, CL, CO2, BUN, CREATININE, CALCIUM, PROT, BILITOT, ALKPHOS, ALT, AST, GLUCOSE in the last 168 hours.  Invalid input(s): LABALBU Lab Results  Component Value Date   TROPONINI <0.03 11/25/2017   TROPONINI <0.03 11/24/2017   TROPONINI <0.03 11/24/2017     Lab Results  Component Value Date   CHOL 160 11/25/2017   Lab Results  Component Value Date   HDL 42 11/25/2017   Lab Results  Component Value Date   LDLCALC 71 11/25/2017   Lab Results  Component Value Date   TRIG 233 (H) 11/25/2017   Lab Results  Component Value Date   CHOLHDL 3.8 11/25/2017   No results found for: LDLDIRECT     Radiology: No results found.  EKG: NSR normal ECG   ASSESSMENT AND PLAN:   Chest pain:  Non obstructive LAD disease coronary CTA 11/24/17  Palpitations : No arrhythmias seen in hospital event monitor April 2019 no arrhythmia To correlate with symptoms PRN inderal ok   Smoking:  Discussed cessation wife is very worried about it CXR NAD  HTN:  Well controlled.  Continue current medications and low sodium Dash type diet.    HLD:  Continue statin    Signed: Theron Arista Nishan10/28/2019, 9:52 AM

## 2018-06-17 ENCOUNTER — Ambulatory Visit: Payer: 59 | Admitting: Cardiovascular Disease

## 2019-04-23 IMAGING — CR DG CHEST 2V
2 series · 2 of 2 positions shown · non-contrast
Comparison: None.

CLINICAL DATA: Upper left chest pain today, smoking history

EXAM:
CHEST - 2 VIEW

[chest pa]
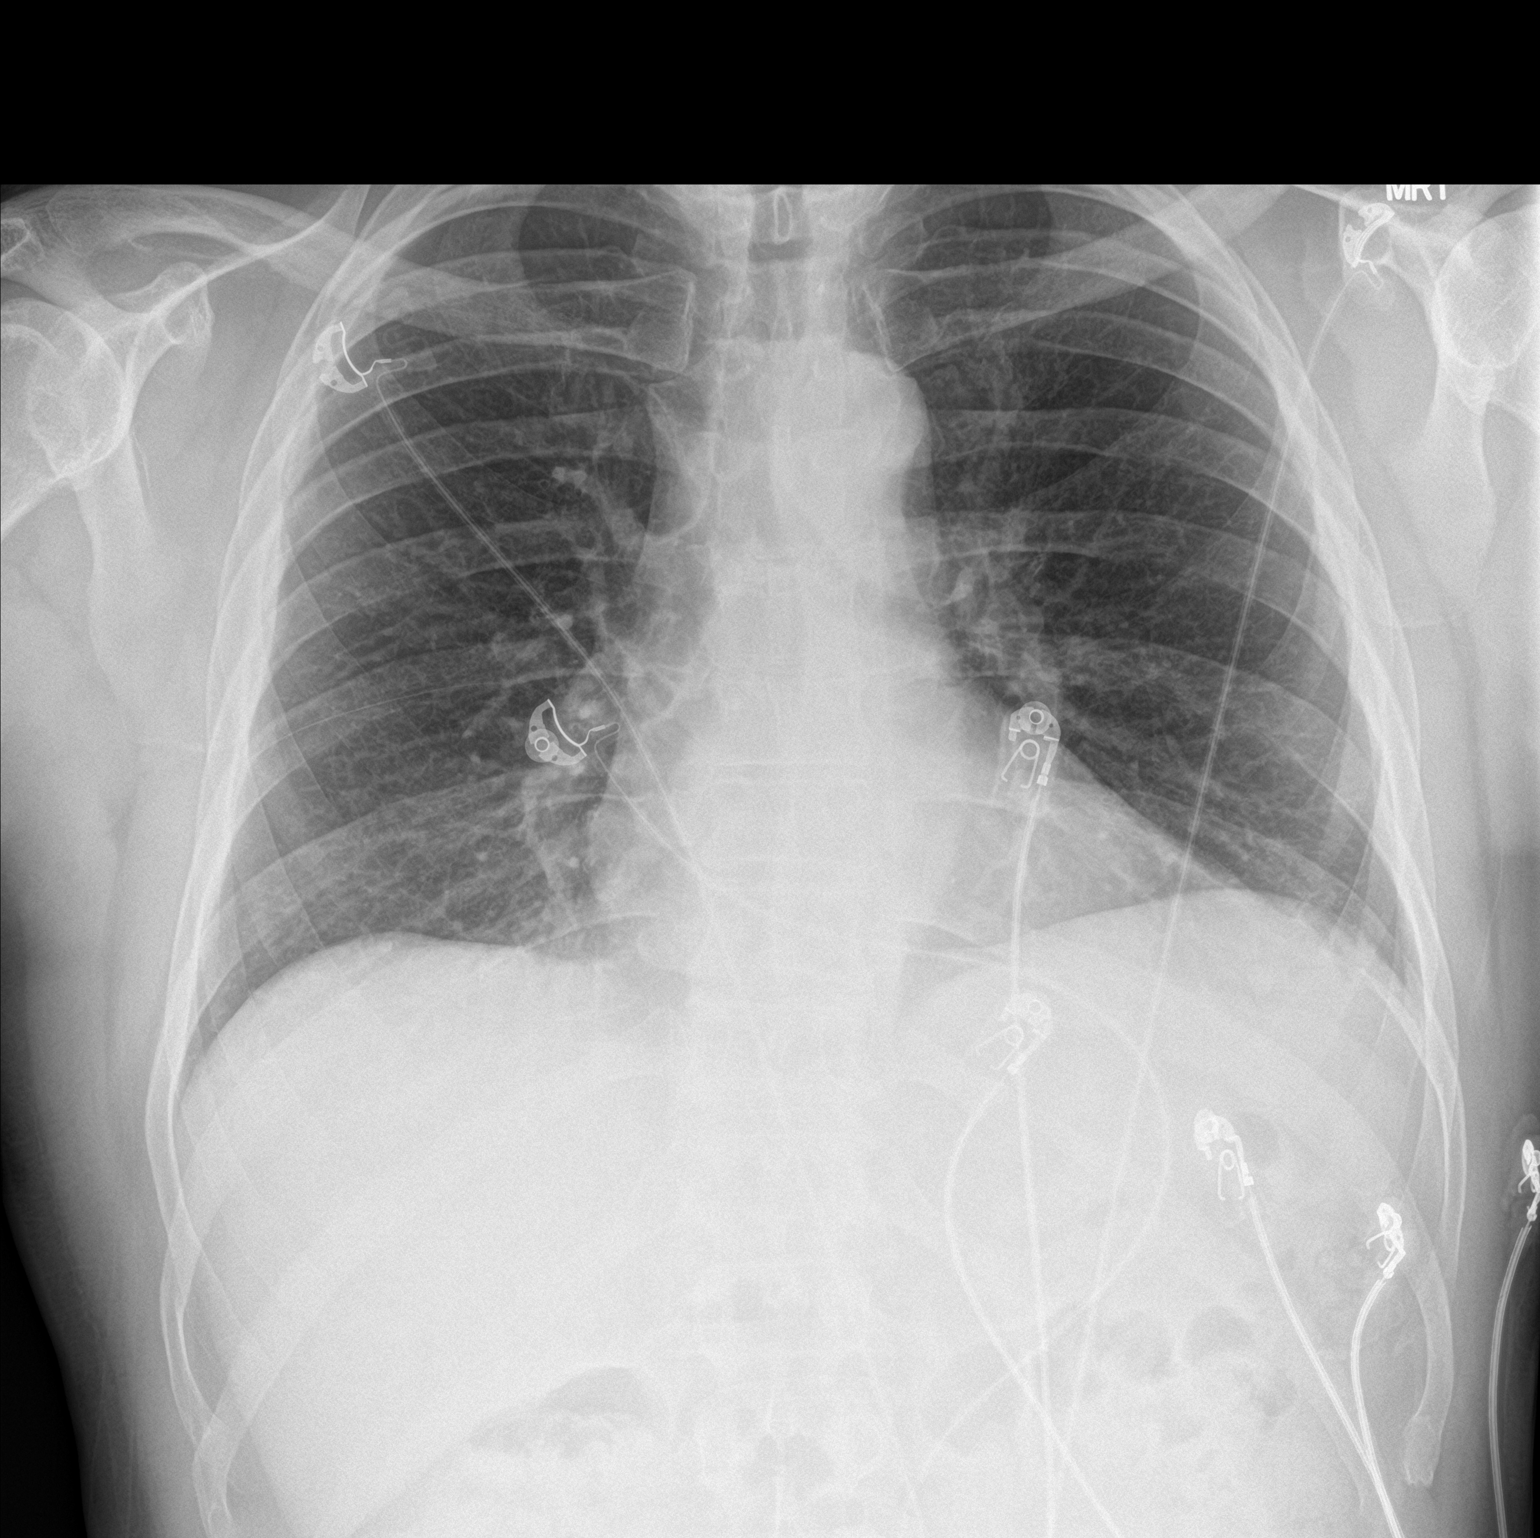

[chest lat]
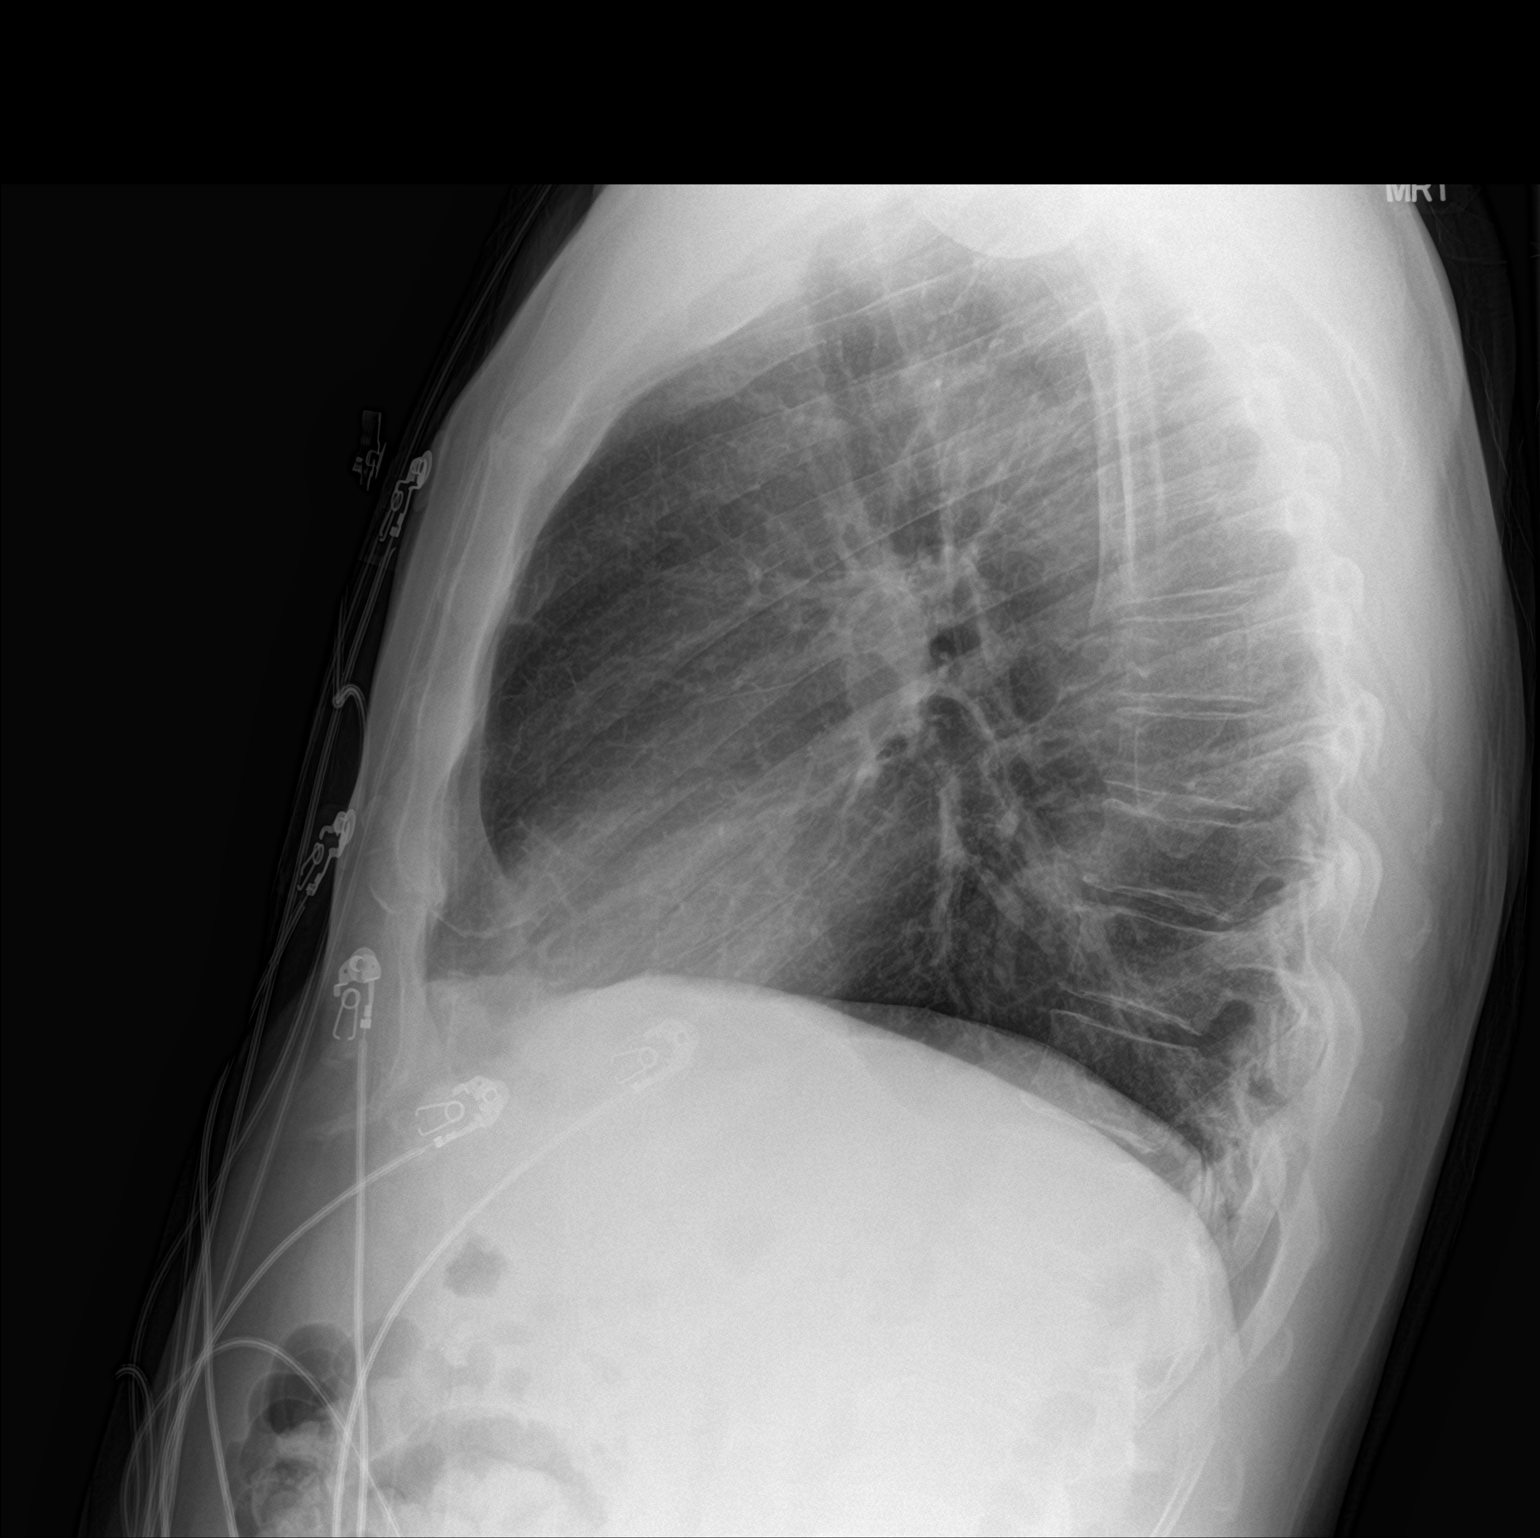

[2 of 2 positions shown; findings below may reference images not displayed]

FINDINGS: No active infiltrate or effusion is seen. Mediastinal and hilar
contours are unremarkable. The heart is within normal limits in
size. No bony abnormality is seen.
IMPRESSION: No active cardiopulmonary disease.

## 2019-04-23 IMAGING — CT CT HEART MORP W/ CTA COR W/ SCORE W/ CA W/CM &/OR W/O CM
4 of 7 series · 8 of 20 positions shown, 9 images · IV contrast (APPLIED)
Comparison: None.

CLINICAL DATA: Chest pain

EXAM:
Cardiac CTA
MEDICATIONS:
Sub lingual nitro. 4mg and lopressor 5mg
TECHNIQUE: The patient was scanned on a Siemens Force [REDACTED]ice scanner. Gantry
rotation speed was 270 msecs. Collimation was .9mm. A 100 kV
prospective scan was triggered in the descending thoracic aorta at
111 HU's with 5% padding centered around 78% of the R-R interval.
Average HR during the scan was 48 bpm. The 3D data set was
interpreted on a dedicated work station using MPR, MIP and VRT
modes. A total of 80 cc of contrast was used.

[Series 6: best diast 67 % · axial · 0.37mm/px · z∈[+1174,+1220]mm · 2 of 343 slices shown, 3 images]
[im 115/343  vessel]
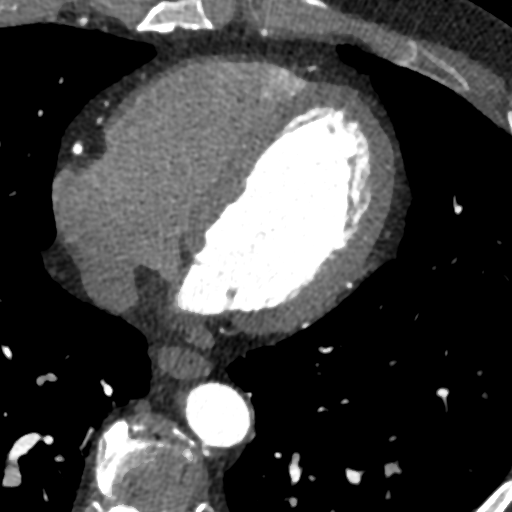
[im 115/343  lung]
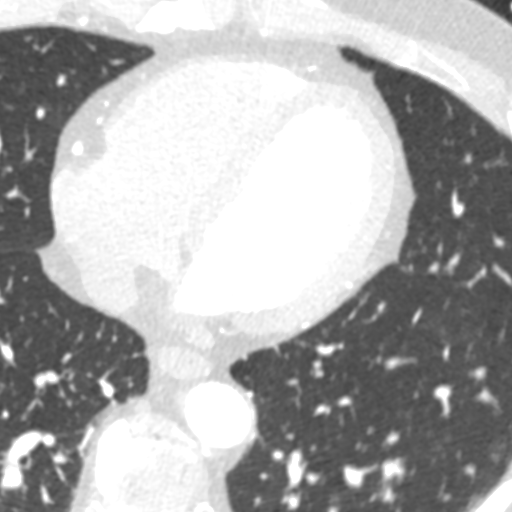
[im 229/343  vessel]
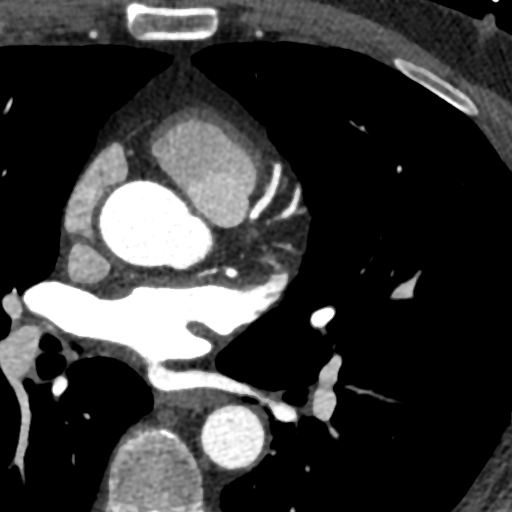

[Series 7: best syst 40 % · axial · 0.37mm/px · z∈[+1174,+1220]mm · 2 of 343 slices shown]
[im 115/343  vessel]
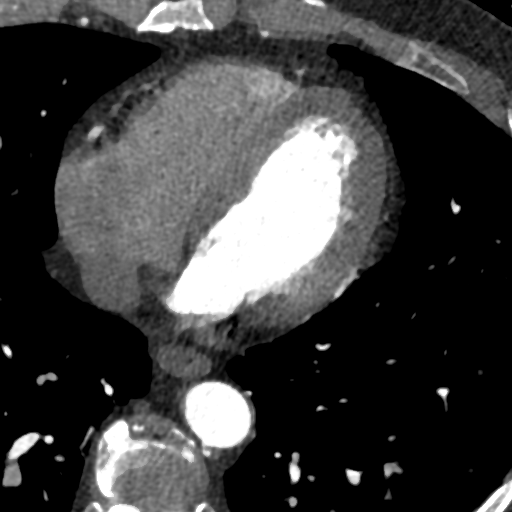
[im 229/343  vessel]
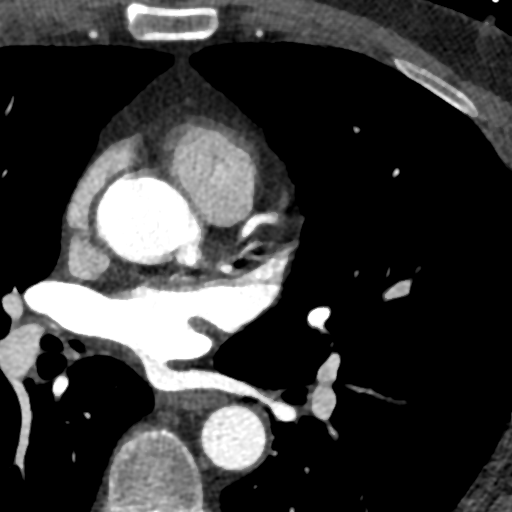

[Series 8: ts diast sharp 67 % · axial · 0.37mm/px · z∈[+1174,+1220]mm · 2 of 343 slices shown]
[im 115/343  lung]
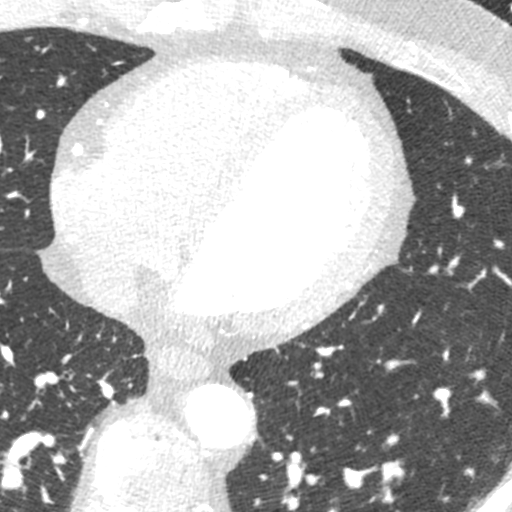
[im 229/343  lung]
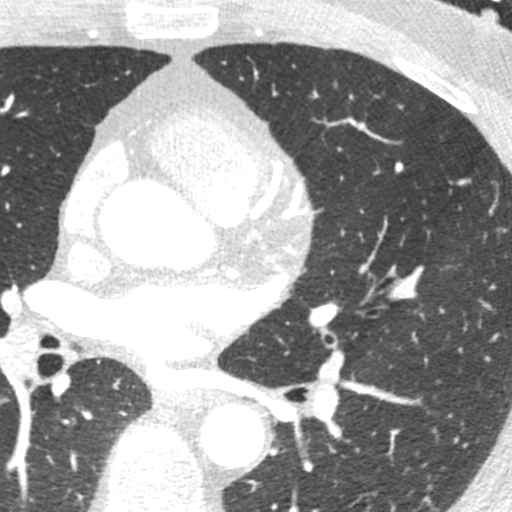

[Series 9: ts syst sharp 40 % · axial · 0.37mm/px · z∈[+1174,+1220]mm · 2 of 343 slices shown]
[im 115/343  lung]
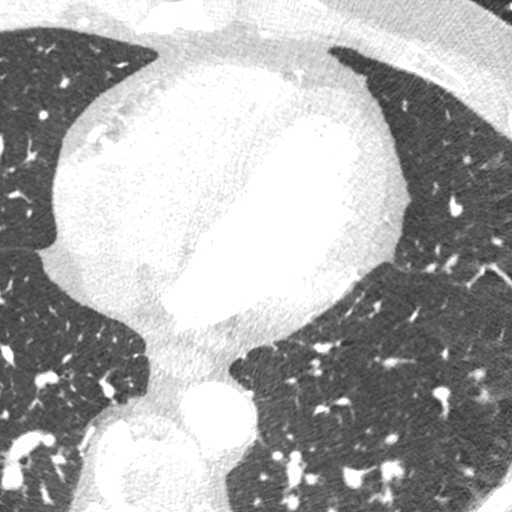
[im 229/343  lung]
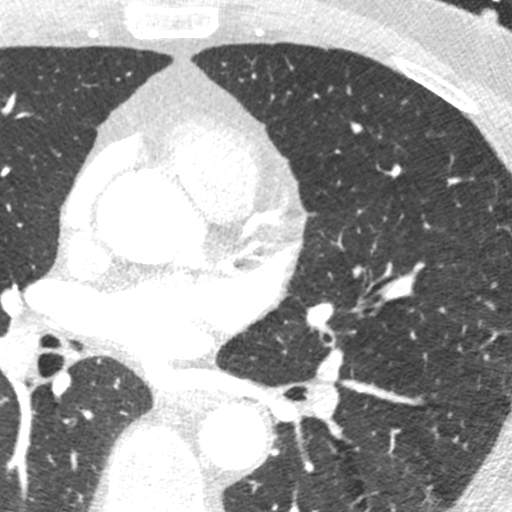

[8 of 20 positions shown; findings below may reference images not displayed]

FINDINGS: Non-cardiac: See separate report from [REDACTED]. No
significant findings on limited lung and soft tissue windows.

Calcium Score: Mild calcium noted in proximal LAD

Coronary Arteries: Right dominant with no anomalies

LM: Normal

LAD: Less than 30% calcific plaque in proximal LAD Large septal
perforator branch

D1: Normal

D2: Normal

D3: Normal

Circumflex: Normal

OM1: Small normal

OM2: Normal

OM3: Normal

RCA: Dominant and normal

PDA: Normal

PLA: Normal
IMPRESSION: 1. Normal aortic root 3.3 cm

2. Calcium score 30 limited to LAD this is 61 st percentile for age
and sex

3. Non obstructive CAD Less than 30% calcific plaque in proximal MICKI

Demitrius Wallen

EXAM:
OVER-READ INTERPRETATION  CT CHEST

The following report is an over-read performed by radiologist Dr.
Ibenk Deny [REDACTED] on 11/24/2017. This
over-read does not include interpretation of cardiac or coronary
anatomy or pathology. The coronary calcium score/coronary CTA
interpretation by the cardiologist is attached.
FINDINGS: Aortic atherosclerosis. Within the visualized portions of the thorax
there are no suspicious appearing pulmonary nodules or masses, there
is no acute consolidative airspace disease, no pleural effusions, no
pneumothorax and no lymphadenopathy. Visualized portions of the
upper abdomen are unremarkable. There are no aggressive appearing
lytic or blastic lesions noted in the visualized portions of the
skeleton.
IMPRESSION: 1.  Aortic Atherosclerosis ([id number]-218.8).
